# Patient Record
Sex: Female | Born: 1970 | Hispanic: Yes | Marital: Married | Smoking: Never smoker
Health system: Southern US, Community
[De-identification: ages and names within clinical notes are randomized; demographics above are authoritative.]

---

## 2004-11-19 ENCOUNTER — Emergency Department: Payer: Self-pay | Admitting: Emergency Medicine

## 2014-07-02 ENCOUNTER — Encounter: Payer: Self-pay | Admitting: Emergency Medicine

## 2014-07-02 ENCOUNTER — Emergency Department: Payer: Self-pay

## 2014-07-02 ENCOUNTER — Emergency Department
Admission: EM | Admit: 2014-07-02 | Discharge: 2014-07-02 | Disposition: A | Discharge status: Home / Self Care | Payer: Self-pay | Attending: Student | Admitting: Student

## 2014-07-02 DIAGNOSIS — Y998 Other external cause status: Secondary | ICD-10-CM | POA: Insufficient documentation

## 2014-07-02 DIAGNOSIS — W450XXA Nail entering through skin, initial encounter: Secondary | ICD-10-CM | POA: Insufficient documentation

## 2014-07-02 DIAGNOSIS — S91235A Puncture wound without foreign body of left lesser toe(s) with damage to nail, initial encounter: Secondary | ICD-10-CM | POA: Insufficient documentation

## 2014-07-02 DIAGNOSIS — Y9289 Other specified places as the place of occurrence of the external cause: Secondary | ICD-10-CM | POA: Insufficient documentation

## 2014-07-02 DIAGNOSIS — Y9389 Activity, other specified: Secondary | ICD-10-CM | POA: Insufficient documentation

## 2014-07-02 DIAGNOSIS — Z792 Long term (current) use of antibiotics: Secondary | ICD-10-CM | POA: Insufficient documentation

## 2014-07-02 MED ORDER — BACITRACIN-NEOMYCIN-POLYMYXIN OINTMENT TUBE
TOPICAL_OINTMENT | Freq: Once | CUTANEOUS | Status: AC
  Administered 2014-07-02: 1 via TOPICAL

## 2014-07-02 MED ORDER — BACITRACIN-NEOMYCIN-POLYMYXIN 400-5-5000 EX OINT
TOPICAL_OINTMENT | CUTANEOUS | Status: AC
  Administered 2014-07-02: 1 via TOPICAL
  Filled 2014-07-02: qty 1

## 2014-07-02 MED ORDER — CIPROFLOXACIN HCL 500 MG PO TABS: 500.0000 mg | ORAL_TABLET | Freq: Two times a day (BID) | ORAL | Status: AC

## 2014-07-02 NOTE — Discharge Instructions (Signed)
Soak foot twice a day for 3 days as directed. Take medication for 10 days.

## 2014-07-02 NOTE — ED Notes (Signed)
Stepped on board with nail in it while gardening

## 2014-07-02 NOTE — ED Provider Notes (Signed)
[facility] Emergency Department Provider Note  ____________________________________________  Time seen: Approximately 1:13 PM  I have reviewed the triage vital signs and the nursing notes.   HISTORY  Chief Complaint Foot Injury    HPI Crystal Nunez is a 44 y.o. female patient here today with complaint of a nail puncture to the plantar aspect of the left foot. Information is obtained through the interpreter. Patient states she was working in the garden when she stepped on a board that had a nail protruding through it patient is wearing tennis shoes at the time of the incident. Patient state that his superficial puncture wound to the dorsal aspect of the right foot with no bleeding. Patient state her last tetanus shot was in October 2015. She rates the pain as a 1/10.   History reviewed. No pertinent past medical history.  There are no active problems to display for this patient.   No past surgical history on file.  Current Outpatient Rx  Name  Route  Sig  Dispense  Refill  . ciprofloxacin (CIPRO) 500 MG tablet   Oral   Take 1 tablet (500 mg total) by mouth 2 (two) times daily.   20 tablet   0     Allergies Review of patient's allergies indicates no known allergies.  No family history on file.  Social History History  Substance Use Topics  . Smoking status: Never Smoker   . Smokeless tobacco: Not on file  . Alcohol Use: No    Review of Systems Constitutional: No fever/chills Eyes: No visual changes. ENT: No sore throat.  Cardiovascular: Denies chest pain. Respiratory: Denies shortness of breath. Gastrointestinal: No abdominal pain.  No nausea, no vomiting.  No diarrhea.  No constipation. Genitourinary: Negative for dysuria. Musculoskeletal: Negative for back pain. Skin: Puncture wound to the dorsal aspect of the left foot Neurological: Negative for headaches, focal weakness or numbness. 10-point ROS otherwise  negative.  ____________________________________________   PHYSICAL EXAM:  VITAL SIGNS: ED Triage Vitals  Enc Vitals Group     BP 07/02/14 1253 104/61 mmHg     Pulse Rate 07/02/14 1253 81     Resp 07/02/14 1253 18     Temp 07/02/14 1253 98.2 F (36.8 C)     Temp Source 07/02/14 1253 Oral     SpO2 07/02/14 1253 95 %     Weight 07/02/14 1253 144 lb (65.318 kg)     Height 07/02/14 1253 5' (1.524 m)     Head Cir --      Peak Flow --      Pain Score 07/02/14 1254 1     Pain Loc --      Pain Edu? --      Excl. in GC? --    Constitutional: Alert and oriented. Well appearing and in no acute distress. Eyes: Conjunctivae are normal. PERRL. EOMI. Head: Atraumatic. Nose: No congestion/rhinnorhea. Mouth/Throat: Mucous membranes are moist.  Oropharynx non-erythematous. Neck: No stridor. C-spine deformity for nuchal range of motion nontender palpation. Hematological/Lymphatic/Immunilogical: No cervical lymphadenopathy. Cardiovascular: Normal rate, regular rhythm. Grossly normal heart sounds.  Good peripheral circulation. Respiratory: Normal respiratory effort.  No retractions. Lungs CTAB. Gastrointestinal: Soft and nontender. No distention. No abdominal bruits. No CVA tenderness. Musculoskeletal: No lower extremity tenderness nor edema.  No joint effusions. Neurologic:  Normal speech and language. No gross focal neurologic deficits are appreciated. Speech is normal. No gait instability. Skin:  Skin is warm, dry and intact. No rash noted. Psychiatric: Mood and  affect are normal. Speech and behavior are normal.  ____________________________________________   LABS (all labs ordered are listed, but only abnormal results are displayed)  Labs Reviewed - No data to display ____________________________________________  EKG  ____________________________________________  RADIOLOGY  ____________________________________________   PROCEDURES  Procedure(s) performed: None  Critical  Care performed: No  ____________________________________________   INITIAL IMPRESSION / ASSESSMENT AND PLAN / ED COURSE  Pertinent labs & imaging results that were available during my care of the patient were reviewed by me and considered in my medical decision making (see chart for details).  Nail puncture left foot ____________________________________________   FINAL CLINICAL IMPRESSION(S) / ED DIAGNOSES  Final diagnoses:  Puncture wound of lesser toe of left foot w/o FB with damage to nail, initial encounter      Joni ReiningRonald K Smith, PA-C 07/02/14 1322  Gayla DossEryka A Gayle, MD 07/02/14 1525

## 2018-02-06 ENCOUNTER — Other Ambulatory Visit: Payer: Self-pay

## 2018-02-06 ENCOUNTER — Encounter: Payer: Self-pay | Admitting: Emergency Medicine

## 2018-02-06 ENCOUNTER — Emergency Department: Payer: Self-pay

## 2018-02-06 ENCOUNTER — Emergency Department
Admission: EM | Admit: 2018-02-06 | Discharge: 2018-02-06 | Disposition: A | Discharge status: Home / Self Care | Payer: Self-pay | Attending: Emergency Medicine | Admitting: Emergency Medicine

## 2018-02-06 DIAGNOSIS — Y998 Other external cause status: Secondary | ICD-10-CM | POA: Insufficient documentation

## 2018-02-06 DIAGNOSIS — Y9302 Activity, running: Secondary | ICD-10-CM | POA: Insufficient documentation

## 2018-02-06 DIAGNOSIS — S0083XA Contusion of other part of head, initial encounter: Secondary | ICD-10-CM | POA: Insufficient documentation

## 2018-02-06 DIAGNOSIS — Y92481 Parking lot as the place of occurrence of the external cause: Secondary | ICD-10-CM | POA: Insufficient documentation

## 2018-02-06 DIAGNOSIS — W01198A Fall on same level from slipping, tripping and stumbling with subsequent striking against other object, initial encounter: Secondary | ICD-10-CM | POA: Insufficient documentation
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
